# Patient Record
Sex: Male | Born: 2000 | Race: Black or African American | Hispanic: Yes | Marital: Single | Smoking: Never smoker
Health system: Southern US, Community
[De-identification: ages and names within clinical notes are randomized; demographics above are authoritative.]

## PROBLEM LIST (undated history)

## (undated) DIAGNOSIS — S82009A Unspecified fracture of unspecified patella, initial encounter for closed fracture: Secondary | ICD-10-CM

## (undated) HISTORY — DX: Unspecified fracture of unspecified patella, initial encounter for closed fracture: S82.009A

---
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
## 2022-03-19 ENCOUNTER — Ambulatory Visit (HOSPITAL_COMMUNITY)
Admission: EM | Admit: 2022-03-19 | Discharge: 2022-03-19 | Disposition: A | Discharge status: Home / Self Care | Payer: Medicaid Other | Attending: Family Medicine | Admitting: Family Medicine

## 2022-03-19 ENCOUNTER — Encounter (HOSPITAL_COMMUNITY): Payer: Self-pay | Admitting: Emergency Medicine

## 2022-03-19 DIAGNOSIS — K529 Noninfective gastroenteritis and colitis, unspecified: Secondary | ICD-10-CM | POA: Diagnosis not present

## 2022-03-19 MED ORDER — ONDANSETRON 4 MG PO TBDP: 4.0000 mg | ORAL_TABLET | Freq: Three times a day (TID) | ORAL | 0 refills | Status: AC | PRN

## 2022-03-19 NOTE — ED Triage Notes (Signed)
Pt reports n/v/d that started today. Reports vomited 4 times today, denies abd pains at this time, just nauseated.

## 2022-03-19 NOTE — Discharge Instructions (Signed)
Please do your best to ensure adequate fluid intake in order to avoid dehydration. If you find that you are unable to tolerate drinking fluids regularly please proceed to the Emergency Department for evaluation. ° ° °

## 2022-03-24 NOTE — ED Provider Notes (Signed)
[facility]   [id number] 03/19/22 Arrival Time: 1929  ASSESSMENT & PLAN:  1. Gastroenteritis    No signs of dehydration req IVF. As needed. Meds ordered this encounter  Medications   ondansetron (ZOFRAN-ODT) 4 MG disintegrating tablet    Sig: Take 1 tablet (4 mg total) by mouth every 8 (eight) hours as needed for nausea or vomiting.    Dispense:  15 tablet    Refill:  0   Discussed typical duration of symptoms for suspected viral GI illness. Will do his best to ensure adequate fluid intake in order to avoid dehydration. Will proceed to the Emergency Department for evaluation if unable to tolerate PO fluids regularly. Work note provided.  Otherwise he will f/u with his PCP or here if not showing improvement over the next 48-72 hours.  Reviewed expectations re: course of current medical issues. Questions answered. Outlined signs and symptoms indicating need for more acute intervention. Patient verbalized understanding. After Visit Summary given.   SUBJECTIVE: History from: patient.  Aaron Atkinson is a 21 y.o. male who presents with complaint of non-bilious, non-bloody intermittent n/v with non-bloody diarrhea. Onset today. Abdominal discomfort: mild and cramping. Symptoms are unchanged since beginning. Aggravating factors: eating. Alleviating factors: none. Associated symptoms: fatigue. He denies fever and sweats. Appetite: decreased. PO intake: decreased. Ambulatory without assistance. Urinary symptoms: none. Sick contacts: none. Recent travel or camping: none. No tx PTA.   History reviewed. No pertinent surgical history.  OBJECTIVE:  Vitals:   03/19/22 1950  BP: 118/78  Pulse: 97  Resp: 15  Temp: 98.5 F (36.9 C)  TempSrc: Oral  SpO2: 97%    General appearance: alert; no distress Oropharynx: slightly dry Lungs: clear to auscultation bilaterally; unlabored Heart: regular rate and rhythm Abdomen: soft; non-distended; no significant abdominal  tenderness; no masses or organomegaly; no guarding or rebound tenderness Back: no CVA tenderness Extremities: no edema; symmetrical with no gross deformities Skin: warm; dry Neurologic: normal gait Psychological: alert and cooperative; normal mood and affect   Allergies  Allergen Reactions   Clindamycin/Lincomycin Anaphylaxis                                               Past Medical History:  Diagnosis Date   Patellar fracture 10/30/13 [place]   Social History   Socioeconomic History   Marital status: Single    Spouse name: Not on file   Number of children: Not on file   Years of education: Not on file   Highest education level: Not on file  Occupational History   Not on file  Tobacco Use   Smoking status: Never   Smokeless tobacco: Never  Substance and Sexual Activity   Alcohol use: No   Drug use: No   Sexual activity: Not on file  Other Topics Concern   Not on file  Social History Narrative   Lives with mother and siblings; visits with father. Mother is at home full-time, disabled due to health condition.   Social Determinants of Health   Financial Resource Strain: Not on file  Food Insecurity: No Food Insecurity (08/29/2018)   Hunger Vital Sign    Worried About Running Out of Food in the Last Year: Never true    Ran Out of Food in the Last Year: Never true  Transportation Needs: Not on file  Physical Activity: Not on file  Stress: Not on file  Social Connections: Not on file  Intimate Partner Violence: Not on file   Family History  Problem Relation Age of Onset   Ulcerative colitis Mother    Asthma Brother    Early death Maternal Aunt    Diabetes Maternal Grandmother    Diabetes Paternal Grandmother    Allergic rhinitis Neg Hx    Angioedema Neg Hx    Atopy Neg Hx    Eczema Neg Hx    Immunodeficiency Neg Hx    Urticaria Neg Hx       Mardella Layman, MD 03/24/22 [phone]
